# Patient Record
Sex: Male | Born: 2000 | Hispanic: No | Marital: Single | Smoking: Never smoker
Health system: Southern US, Community
[De-identification: ages and names within clinical notes are randomized; demographics above are authoritative.]

## PROBLEM LIST (undated history)

## (undated) DIAGNOSIS — N200 Calculus of kidney: Secondary | ICD-10-CM

## (undated) HISTORY — DX: Calculus of kidney: N20.0

---
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
## 2022-01-10 DIAGNOSIS — Z419 Encounter for procedure for purposes other than remedying health state, unspecified: Secondary | ICD-10-CM | POA: Diagnosis not present

## 2022-02-10 DIAGNOSIS — Z419 Encounter for procedure for purposes other than remedying health state, unspecified: Secondary | ICD-10-CM | POA: Diagnosis not present

## 2022-03-13 DIAGNOSIS — Z419 Encounter for procedure for purposes other than remedying health state, unspecified: Secondary | ICD-10-CM | POA: Diagnosis not present

## 2022-04-11 DIAGNOSIS — Z419 Encounter for procedure for purposes other than remedying health state, unspecified: Secondary | ICD-10-CM | POA: Diagnosis not present

## 2022-05-12 DIAGNOSIS — Z419 Encounter for procedure for purposes other than remedying health state, unspecified: Secondary | ICD-10-CM | POA: Diagnosis not present

## 2022-06-11 DIAGNOSIS — Z419 Encounter for procedure for purposes other than remedying health state, unspecified: Secondary | ICD-10-CM | POA: Diagnosis not present

## 2022-07-12 DIAGNOSIS — Z419 Encounter for procedure for purposes other than remedying health state, unspecified: Secondary | ICD-10-CM | POA: Diagnosis not present

## 2022-08-11 DIAGNOSIS — Z419 Encounter for procedure for purposes other than remedying health state, unspecified: Secondary | ICD-10-CM | POA: Diagnosis not present

## 2022-09-11 DIAGNOSIS — Z419 Encounter for procedure for purposes other than remedying health state, unspecified: Secondary | ICD-10-CM | POA: Diagnosis not present

## 2022-10-09 DIAGNOSIS — H5213 Myopia, bilateral: Secondary | ICD-10-CM | POA: Diagnosis not present

## 2022-10-12 DIAGNOSIS — Z419 Encounter for procedure for purposes other than remedying health state, unspecified: Secondary | ICD-10-CM | POA: Diagnosis not present

## 2022-10-16 ENCOUNTER — Ambulatory Visit (INDEPENDENT_AMBULATORY_CARE_PROVIDER_SITE_OTHER): Payer: Medicaid Other | Admitting: Physician Assistant

## 2022-10-16 ENCOUNTER — Encounter: Payer: Self-pay | Admitting: Physician Assistant

## 2022-10-16 VITALS — BP 112/74 | HR 107 | Temp 98.1°F | Ht 64.0 in | Wt 130.8 lb

## 2022-10-16 DIAGNOSIS — Z23 Encounter for immunization: Secondary | ICD-10-CM

## 2022-10-16 DIAGNOSIS — Z0000 Encounter for general adult medical examination without abnormal findings: Secondary | ICD-10-CM | POA: Diagnosis not present

## 2022-10-16 NOTE — Progress Notes (Signed)
New patient visit   Patient: Brad Lane   DOB: 2001/01/11   21 y.o. Male  MRN: [id number] Visit Date: 10/16/2022  Today's healthcare provider: Alfredia Ferguson, PA-C   Chief Complaint  Patient presents with   Establish Care   Subjective    Brad Lane is a 22 y.o. male who presents today as a new patient to establish care.  HPI   Discussed the use of AI scribe software for clinical note transcription with the patient, who gave verbal consent to proceed.  History of Present Illness   The patient presents to establish care and for a routine physical exam. They deny any current health concerns. They deny smoking, vaping, and drug use. They are not currently sexually active and have never been in the past. They have not had any surgeries.      Past Medical History:  Diagnosis Date   Kidney stone    History reviewed. No pertinent surgical history. Family Status  Relation Name Status   Father  (Not Specified)  No partnership data on file   Family History  Problem Relation Age of Onset   Hypertension Father    Social History   Socioeconomic History   Marital status: Single    Spouse name: Not on file   Number of children: Not on file   Years of education: Not on file   Highest education level: Not on file  Occupational History   Not on file  Tobacco Use   Smoking status: Never   Smokeless tobacco: Never  Vaping Use   Vaping status: Never Used  Substance and Sexual Activity   Alcohol use: Never   Drug use: Never   Sexual activity: Never  Other Topics Concern   Not on file  Social History Narrative   Not on file   Social Determinants of Health   Financial Resource Strain: Not on file  Food Insecurity: Not on file  Transportation Needs: Not on file  Physical Activity: Not on file  Stress: Not on file  Social Connections: Not on file   Outpatient Medications Prior to Visit  Medication Sig   [DISCONTINUED] guaiFENesin-codeine (ROBITUSSIN AC) 100-10  MG/5ML syrup Take 5 mLs by mouth 3 (three) times daily as needed for cough.   [DISCONTINUED] HYDROcodone-acetaminophen (NORCO/VICODIN) 5-325 MG tablet Take 1-2 tablets by mouth every 6 (six) hours as needed.   No facility-administered medications prior to visit.   No Known Allergies  Immunization History  Administered Date(s) Administered   Tdap 04/02/2015    Health Maintenance  Topic Date Due   HPV VACCINES (1 - Male 3-dose series) Never done   HIV Screening  Never done   Hepatitis C Screening  Never done   INFLUENZA VACCINE  Never done   COVID-19 Vaccine (1 - 2023-24 season) Never done   DTaP/Tdap/Td (2 - Td or Tdap) 04/01/2025    Patient Care Team: Alfredia Ferguson, PA-C as PCP - General (Physician Assistant)  Review of Systems  Constitutional:  Negative for fatigue and fever.  Respiratory:  Negative for cough and shortness of breath.   Cardiovascular:  Negative for chest pain, palpitations and leg swelling.  Genitourinary:  Positive for frequency.  Neurological:  Negative for dizziness and headaches.    Objective    BP 112/74 (BP Location: Left Arm, Patient Position: Sitting, Cuff Size: Normal)   Pulse (!) 107   Temp 98.1 F (36.7 C) (Oral)   Ht 5\' 4"  (1.626 m)   Wt 130 lb 12.8  oz (59.3 kg)   SpO2 99%   BMI 22.45 kg/m   Physical Exam Constitutional:      General: He is awake.     Appearance: He is well-developed.  HENT:     Head: Normocephalic.     Right Ear: Tympanic membrane, ear canal and external ear normal.     Left Ear: Tympanic membrane, ear canal and external ear normal.     Nose: Nose normal. No congestion or rhinorrhea.     Mouth/Throat:     Mouth: Mucous membranes are moist.     Pharynx: No oropharyngeal exudate or posterior oropharyngeal erythema.  Eyes:     Pupils: Pupils are equal, round, and reactive to light.  Cardiovascular:     Rate and Rhythm: Normal rate and regular rhythm.     Heart sounds: Normal heart sounds.  Pulmonary:      Effort: Pulmonary effort is normal.     Breath sounds: Normal breath sounds.  Abdominal:     General: There is no distension.     Palpations: Abdomen is soft.     Tenderness: There is no abdominal tenderness. There is no guarding.  Musculoskeletal:     Cervical back: Normal range of motion.     Right lower leg: No edema.     Left lower leg: No edema.  Lymphadenopathy:     Cervical: No cervical adenopathy.  Skin:    General: Skin is warm.  Neurological:     Mental Status: He is alert and oriented to person, place, and time.  Psychiatric:        Attention and Perception: Attention normal.        Mood and Affect: Mood normal.        Speech: Speech normal.        Behavior: Behavior normal. Behavior is cooperative.     Depression Screen    10/16/2022    3:25 PM  PHQ 2/9 Scores  PHQ - 2 Score 0   No results found for any visits on 10/16/22.  Assessment & Plan     1. Annual physical exam Vaccination Status Discussed HPV and meningitis vaccines. Patient declined at this time but will consider for future. -Administer influenza vaccine today. -Discuss HPV and meningitis B vaccines at future visits.  Routine Health Screening Discussed routine blood work for health screening. Patient declined today but agreed to consider in the next year.  General recommendations --balanced diet high in fiber and protein, low in sugars, carbs, fats. --physical activity/exercise 20-30 minutes 3-5 times a week    Sexual Health Not currently sexually active. Discussed availability of STI screening if needed in the future.    2. Flu vaccine need - Flu vaccine trivalent PF, 6mos and older(Flulaval,Afluria,Fluarix,Fluzone)   Return in about 1 year (around 10/16/2023) for CPE.  I, Alfredia Ferguson, PA-C have reviewed all documentation for this visit. The documentation on  10/16/22   for the exam, diagnosis, procedures, and orders are all accurate and complete.    Alfredia Ferguson, PA-C  [facility] at [facility] [phone] (phone) [phone] (fax)  [facility]

## 2022-11-11 DIAGNOSIS — Z419 Encounter for procedure for purposes other than remedying health state, unspecified: Secondary | ICD-10-CM | POA: Diagnosis not present

## 2022-12-12 DIAGNOSIS — Z419 Encounter for procedure for purposes other than remedying health state, unspecified: Secondary | ICD-10-CM | POA: Diagnosis not present

## 2023-01-11 DIAGNOSIS — Z419 Encounter for procedure for purposes other than remedying health state, unspecified: Secondary | ICD-10-CM | POA: Diagnosis not present

## 2023-02-11 DIAGNOSIS — Z419 Encounter for procedure for purposes other than remedying health state, unspecified: Secondary | ICD-10-CM | POA: Diagnosis not present

## 2023-03-14 DIAGNOSIS — Z419 Encounter for procedure for purposes other than remedying health state, unspecified: Secondary | ICD-10-CM | POA: Diagnosis not present

## 2023-04-11 DIAGNOSIS — Z419 Encounter for procedure for purposes other than remedying health state, unspecified: Secondary | ICD-10-CM | POA: Diagnosis not present

## 2023-05-23 DIAGNOSIS — Z419 Encounter for procedure for purposes other than remedying health state, unspecified: Secondary | ICD-10-CM | POA: Diagnosis not present

## 2023-06-22 DIAGNOSIS — Z419 Encounter for procedure for purposes other than remedying health state, unspecified: Secondary | ICD-10-CM | POA: Diagnosis not present

## 2023-06-23 ENCOUNTER — Emergency Department (HOSPITAL_BASED_OUTPATIENT_CLINIC_OR_DEPARTMENT_OTHER)

## 2023-06-23 ENCOUNTER — Emergency Department (HOSPITAL_BASED_OUTPATIENT_CLINIC_OR_DEPARTMENT_OTHER)
Admission: EM | Admit: 2023-06-23 | Discharge: 2023-06-23 | Disposition: A | Discharge status: Home / Self Care | Attending: Emergency Medicine | Admitting: Emergency Medicine

## 2023-06-23 ENCOUNTER — Other Ambulatory Visit: Payer: Self-pay

## 2023-06-23 ENCOUNTER — Encounter (HOSPITAL_BASED_OUTPATIENT_CLINIC_OR_DEPARTMENT_OTHER): Payer: Self-pay | Admitting: Urology

## 2023-06-23 DIAGNOSIS — D72829 Elevated white blood cell count, unspecified: Secondary | ICD-10-CM | POA: Diagnosis not present

## 2023-06-23 DIAGNOSIS — R109 Unspecified abdominal pain: Secondary | ICD-10-CM | POA: Diagnosis not present

## 2023-06-23 LAB — BASIC METABOLIC PANEL WITH GFR
Anion gap: 11 (ref 5–15)
BUN: 10 mg/dL (ref 6–20)
CO2: 24 mmol/L (ref 22–32)
Calcium: 9.5 mg/dL (ref 8.9–10.3)
Chloride: 103 mmol/L (ref 98–111)
Creatinine, Ser: 0.9 mg/dL (ref 0.61–1.24)
GFR, Estimated: >60 mL/min (ref >60)
Glucose, Bld: 102 mg/dL — ABNORMAL HIGH (ref 70–99)
Potassium: 4.1 mmol/L (ref 3.5–5.1)
Sodium: 139 mmol/L (ref 135–145)

## 2023-06-23 LAB — URINALYSIS, ROUTINE W REFLEX MICROSCOPIC
Bilirubin Urine: NEGATIVE
Glucose, UA: NEGATIVE mg/dL
Ketones, ur: NEGATIVE mg/dL
Leukocytes,Ua: NEGATIVE
Nitrite: NEGATIVE
Protein, ur: NEGATIVE mg/dL
Specific Gravity, Urine: 1.025 (ref 1.005–1.030)
pH: 7 (ref 5.0–8.0)

## 2023-06-23 LAB — URINALYSIS, MICROSCOPIC (REFLEX)
RBC / HPF: >50 RBC/hpf (ref 0–5)
Squamous Epithelial / HPF: NONE SEEN /HPF (ref 0–5)

## 2023-06-23 LAB — CBC
HCT: 43 % (ref 39.0–52.0)
Hemoglobin: 14.9 g/dL (ref 13.0–17.0)
MCH: 30.7 pg (ref 26.0–34.0)
MCHC: 34.7 g/dL (ref 30.0–36.0)
MCV: 88.7 fL (ref 80.0–100.0)
Platelets: 193 10*3/uL (ref 150–400)
RBC: 4.85 MIL/uL (ref 4.22–5.81)
RDW: 11.8 % (ref 11.5–15.5)
WBC: 10.6 10*3/uL — ABNORMAL HIGH (ref 4.0–10.5)
nRBC: 0 % (ref 0.0–0.2)

## 2023-06-23 MED ORDER — MELOXICAM 15 MG PO TABS: 15.0000 mg | ORAL_TABLET | Freq: Every day | ORAL | 0 refills | Status: AC | PRN

## 2023-06-23 MED ORDER — OXYCODONE-ACETAMINOPHEN 5-325 MG PO TABS
1.0000 | ORAL_TABLET | Freq: Once | ORAL | Status: AC
  Administered 2023-06-23: 1 via ORAL
  Filled 2023-06-23: qty 1

## 2023-06-23 MED ORDER — ONDANSETRON 4 MG PO TBDP: 4.0000 mg | ORAL_TABLET | Freq: Three times a day (TID) | ORAL | 0 refills | Status: AC | PRN

## 2023-06-23 MED ORDER — MORPHINE SULFATE (PF) 4 MG/ML IV SOLN
4.0000 mg | Freq: Once | INTRAVENOUS | Status: AC
  Administered 2023-06-23: 4 mg via INTRAVENOUS
  Filled 2023-06-23: qty 1

## 2023-06-23 MED ORDER — SODIUM CHLORIDE 0.9 % IV BOLUS
1000.0000 mL | Freq: Once | INTRAVENOUS | Status: AC
  Administered 2023-06-23: 1000 mL via INTRAVENOUS

## 2023-06-23 MED ORDER — ONDANSETRON 4 MG PO TBDP
8.0000 mg | ORAL_TABLET | Freq: Once | ORAL | Status: AC
  Administered 2023-06-23: 8 mg via ORAL
  Filled 2023-06-23: qty 2

## 2023-06-23 NOTE — ED Triage Notes (Signed)
 Right side flank pain that started this am  Pain with urination    H/o kidney stone feels the same

## 2023-06-23 NOTE — Discharge Instructions (Addendum)
 As discussed, your workup today was overall reassuring.  It appears that you have most likely just passed the stone on the right side.  Your urine had significant mount of blood in CT scan appeared normal.  You do have very small stones in both of your kidneys not causing obstruction or any problem at this time but could in the future.  Will send you home with medicine for nausea as well as for pain.  Please do not hesitate to return to emergency department if the worrisome signs and symptoms we discussed become apparent.

## 2023-06-23 NOTE — ED Notes (Signed)
 Pt says he is unable to get UA at this time.

## 2023-06-23 NOTE — ED Provider Notes (Signed)
 [facility] Provider Note   CSN: [id number] Arrival date & time: 06/23/23  1106     History  Chief Complaint  Patient presents with   Flank Pain    Brad Lane is a 23 y.o. male.   Flank Pain   23 year old male presents emergency department complaints of right-sided flank pain.  Patient reports abrupt onset right-sided flank pain when he went to urinate this morning after awakening.  States he has a history of kidney stone on the left side and this feels very similar.  Denies any radiation of pain.  States that whenever he bears down to have a bowel movement or urinate, infectious pain worse.  To take Tylenol  without improvement of symptoms.  Reports feelings of nausea without emesis.  Denies any fever, dysuria, noticeable hematuria.  Past medical history significant for nephrolithiasis.  Home Medications Prior to Admission medications   Medication Sig Start Date End Date Taking? Authorizing Provider  meloxicam (MOBIC) 15 MG tablet Take 1 tablet (15 mg total) by mouth daily as needed. 06/23/23  Yes Neil Balls A, PA  ondansetron  (ZOFRAN -ODT) 4 MG disintegrating tablet Take 1 tablet (4 mg total) by mouth every 8 (eight) hours as needed. 06/23/23  Yes [place] Butter, PA      Allergies    Patient has no known allergies.    Review of Systems   Review of Systems  Genitourinary:  Positive for flank pain.  All other systems reviewed and are negative.   Physical Exam Updated Vital Signs BP 100/63   Pulse 68   Temp 97.6 F (36.4 C)   Resp 16   Ht 5\' 4"  (1.626 m)   Wt 59.3 kg   SpO2 99%   BMI 22.44 kg/m  Physical Exam Vitals and nursing note reviewed.  Constitutional:      General: He is not in acute distress.    Appearance: He is well-developed.  HENT:     Head: Normocephalic and atraumatic.  Eyes:     Conjunctiva/sclera: Conjunctivae normal.  Cardiovascular:     Rate and Rhythm: Normal rate and regular rhythm.      Heart sounds: No murmur heard. Pulmonary:     Effort: Pulmonary effort is normal. No respiratory distress.     Breath sounds: Normal breath sounds.  Abdominal:     Palpations: Abdomen is soft.     Tenderness: There is no abdominal tenderness. There is right CVA tenderness. There is no left CVA tenderness.     Comments: No intra-abdominal tenderness.  Right-sided CVA tenderness.  Musculoskeletal:        General: No swelling.     Cervical back: Neck supple.  Skin:    General: Skin is warm and dry.     Capillary Refill: Capillary refill takes less than 2 seconds.  Neurological:     Mental Status: He is alert.  Psychiatric:        Mood and Affect: Mood normal.     ED Results / Procedures / Treatments   Labs (all labs ordered are listed, but only abnormal results are displayed) Labs Reviewed  BASIC METABOLIC PANEL WITH GFR - Abnormal; Notable for the following components:      Result Value   Glucose, Bld 102 (*)    All other components within normal limits  CBC - Abnormal; Notable for the following components:   WBC 10.6 (*)    All other components within normal limits  URINALYSIS, ROUTINE W REFLEX  MICROSCOPIC - Abnormal; Notable for the following components:   APPearance HAZY (*)    Hgb urine dipstick LARGE (*)    All other components within normal limits  URINALYSIS, MICROSCOPIC (REFLEX) - Abnormal; Notable for the following components:   Bacteria, UA FEW (*)    All other components within normal limits    EKG None  Radiology CT Renal Stone Study Result Date: 06/23/2023 CLINICAL DATA:  Abdominal/flank pain.  Concern for kidney stone. EXAM: CT ABDOMEN AND PELVIS WITHOUT CONTRAST TECHNIQUE: Multidetector CT imaging of the abdomen and pelvis was performed following the standard protocol without IV contrast. RADIATION DOSE REDUCTION: This exam was performed according to the departmental dose-optimization program which includes automated exposure control, adjustment of the mA  and/or kV according to patient size and/or use of iterative reconstruction technique. COMPARISON:  CT dated 12/26/2021. FINDINGS: Evaluation of this exam is limited in the absence of intravenous contrast. Lower chest: The visualized lung bases are clear. No intra-abdominal free air or free fluid. Hepatobiliary: No focal liver abnormality is seen. No gallstones, gallbladder wall thickening, or biliary dilatation. Pancreas: Unremarkable. No pancreatic ductal dilatation or surrounding inflammatory changes. Spleen: Normal in size without focal abnormality. Adrenals/Urinary Tract: The adrenal glands unremarkable. Faint slightly higher attenuating areas in the renal collecting systems bilaterally may represent tiny nonobstructing calculi. No hydronephrosis or obstructing stone. The visualized ureters and urinary bladder appear unremarkable. Stomach/Bowel: There is no bowel obstruction or active inflammation. The appendix is normal. Vascular/Lymphatic: The abdominal aorta and IVC are unremarkable. No portal venous gas. There is no adenopathy. Reproductive: The prostate and seminal vesicles are grossly unremarkable. Other: None Musculoskeletal: No acute or significant osseous findings. IMPRESSION: 1. No acute intra-abdominal or pelvic pathology. No hydronephrosis or obstructing stone. 2. Possible tiny nonobstructing bilateral renal calculi. Electronically Signed   By: Angus Bark M.D.   On: 06/23/2023 13:41    Procedures Procedures    Medications Ordered in ED Medications  oxyCODONE-acetaminophen  (PERCOCET/ROXICET) 5-325 MG per tablet 1 tablet (1 tablet Oral Given 06/23/23 1126)  ondansetron  (ZOFRAN -ODT) disintegrating tablet 8 mg (8 mg Oral Given 06/23/23 1127)  sodium chloride 0.9 % bolus 1,000 mL (0 mLs Intravenous Stopped 06/23/23 1223)  morphine  (PF) 4 MG/ML injection 4 mg (4 mg Intravenous Given 06/23/23 1222)    ED Course/ Medical Decision Making/ A&P Clinical Course as of 06/23/23 1422  Tue Jun 23, 2023  1219 Messaged by nurse.  Patient still with complaints of pain.  Stimulant responder airway oxycodone.  Will trial dose of IV pain medication and reassess.  Pending CT study/UA at this time. [CR]    Clinical Course User Index [CR] [place] Butter, PA                                 Medical Decision Making Amount and/or Complexity of Data Reviewed Labs: ordered. Radiology: ordered.  Risk Prescription drug management.   This patient presents to the ED for concern of right flank pain, this involves an extensive number of treatment options, and is a complaint that carries with it a high risk of complications and morbidity.  The differential diagnosis includes pyelonephritis, nephrolithiasis, musculoskeletal etiology, other   Co morbidities that complicate the patient evaluation  See HPI   Additional history obtained:  Additional history obtained from EMR External records from outside source obtained and reviewed including hospital records   Lab Tests:  I Ordered, and personally interpreted labs.  The pertinent results include: No electrolyte abnormalities.  No renal dysfunction.  Leukocytosis of 10.6.  No evidence of anemia.  Placed within range.  UA with few bacteria, greater than 50 RBCs with negative nitrite, leukocyte, negative WBCs.   Imaging Studies ordered:  I ordered imaging studies including CT renal stone I independently visualized and interpreted imaging which showed no acute abnormality.  Small nonobstructing bilateral nephrolithiasis I agree with the radiologist interpretation   Cardiac Monitoring: / EKG:  The patient was maintained on a cardiac monitor.  I personally viewed and interpreted the cardiac monitored which showed an underlying rhythm of: Sinus rhythm   Consultations Obtained:  N/a   Problem List / ED Course / Critical interventions / Medication management  Right flank pain I ordered medication including nausea,  Percocet Reevaluation of the patient after these medicines showed that the patient improved I have reviewed the patients home medicines and have made adjustments as needed   Social Determinants of Health:  Denies tobacco, illicit drug use.   Test / Admission - Considered:  Right flank pain Vitals signs within normal range and stable throughout visit. Laboratory/imaging studies significant for: See above 23 year old male presents emergency department complaints of right-sided flank pain.  Patient reports abrupt onset right-sided flank pain when he went to urinate this morning after awakening.  States he has a history of kidney stone on the left side and this feels very similar.  Denies any radiation of pain.  States that whenever he bears down to have a bowel movement or urinate, infectious pain worse.  To take Tylenol  without improvement of symptoms.  Reports feelings of nausea without emesis.  Denies any fever, dysuria, noticeable hematuria. On exam initially, patient with right-sided CVA tenderness.  No abdominal tenderness.  Workup today concerning for most likely recently passed stone.  Patient with UA greater than 50 RBCs.  No evidence of infection on UA.  CT stone study without evidence of obstructing stone at this time.  Patient did have significant relief of symptoms just prior to CT scan.  Suspect passed kidney stone as cause of patient's symptoms.  Will recommend dietary modifications to decrease likelihood of recurrence of stones.  Will give patient information for urology.  Treatment plan discussed with patient and he acknowledged understanding was agreeable to some plan.  Patient overall well-appearing, afebrile in no acute distress. Worrisome signs and symptoms were discussed with the patient, and the patient acknowledged understanding to return to the ED if noticed. Patient was stable upon discharge.          Final Clinical Impression(s) / ED Diagnoses Final diagnoses:   Right flank pain    Rx / DC Orders ED Discharge Orders     None         [place] Butter, [place] 06/23/23 1422    Rosealee Concha, MD 06/23/23 1507

## 2023-07-23 DIAGNOSIS — Z419 Encounter for procedure for purposes other than remedying health state, unspecified: Secondary | ICD-10-CM | POA: Diagnosis not present

## 2023-08-22 DIAGNOSIS — Z419 Encounter for procedure for purposes other than remedying health state, unspecified: Secondary | ICD-10-CM | POA: Diagnosis not present

## 2023-09-22 DIAGNOSIS — Z419 Encounter for procedure for purposes other than remedying health state, unspecified: Secondary | ICD-10-CM | POA: Diagnosis not present

## 2023-10-22 ENCOUNTER — Encounter: Payer: Medicaid Other | Admitting: Physician Assistant

## 2023-10-23 DIAGNOSIS — Z419 Encounter for procedure for purposes other than remedying health state, unspecified: Secondary | ICD-10-CM | POA: Diagnosis not present
